# Patient Record
Sex: Male | Born: 1975 | Race: White | Hispanic: No | Marital: Single | Smoking: Never smoker
Health system: Southern US, Community
[De-identification: ages and names within clinical notes are randomized; demographics above are authoritative.]

## PROBLEM LIST (undated history)

## (undated) DIAGNOSIS — A4902 Methicillin resistant Staphylococcus aureus infection, unspecified site: Secondary | ICD-10-CM

## (undated) HISTORY — PX: FRACTURE SURGERY: SHX138

---

## 2018-03-13 ENCOUNTER — Ambulatory Visit (INDEPENDENT_AMBULATORY_CARE_PROVIDER_SITE_OTHER): Payer: 59

## 2018-03-13 ENCOUNTER — Other Ambulatory Visit: Payer: Self-pay

## 2018-03-13 ENCOUNTER — Encounter (HOSPITAL_COMMUNITY): Payer: Self-pay | Admitting: Emergency Medicine

## 2018-03-13 ENCOUNTER — Ambulatory Visit (HOSPITAL_COMMUNITY)
Admission: EM | Admit: 2018-03-13 | Discharge: 2018-03-13 | Disposition: A | Discharge status: Home / Self Care | Payer: 59 | Attending: Internal Medicine | Admitting: Internal Medicine

## 2018-03-13 DIAGNOSIS — S300XXA Contusion of lower back and pelvis, initial encounter: Secondary | ICD-10-CM

## 2018-03-13 MED ORDER — OXYCODONE-ACETAMINOPHEN 5-325 MG PO TABS: 2.0000 | ORAL_TABLET | ORAL | 0 refills | Status: DC | PRN

## 2018-03-13 NOTE — ED Triage Notes (Signed)
Pt was seen 10 days ago in the ED in [place] for this back pain.  He was given muscle relaxers and 600 mg Ibuprofen.  They help some, but he states the muscle relaxer makes him dizzy.

## 2018-03-13 NOTE — ED Provider Notes (Signed)
[facility]    CSN: [id number] Arrival date & time: 03/13/18  1007     History   Chief Complaint Chief Complaint  Patient presents with  . Back Pain    lower    HPI John Lucero is a 42 y.o. male.   42 yo male complains of low back pain x6 days ago. Seen at ED in [place] 5 days ago and dx w/ musculoskeletal pain. No relief from pain meds. Denies loss of bowel or bladder continence. No weakness or numbness in legs. He cannot recall the moment his back began to hurt. Denies trauma to spine or falls.      History reviewed. No pertinent past medical history.  There are no active problems to display for this patient.   Past Surgical History:  Procedure Laterality Date  . FRACTURE SURGERY         Home Medications    Prior to Admission medications   Medication Sig Start Date End Date Taking? Authorizing Provider  cyclobenzaprine (FLEXERIL) 10 MG tablet Take 10 mg by mouth 3 (three) times daily as needed. 03/07/18  Yes [provider]  ibuprofen (ADVIL,MOTRIN) 600 MG tablet Take 600 mg by mouth every 6 (six) hours as needed. 03/07/18 03/17/18 Yes [provider]  oxyCODONE-acetaminophen (PERCOCET/ROXICET) 5-325 MG tablet Take 2 tablets by mouth every 4 (four) hours as needed for severe pain. 03/13/18   Arnaldo Nataliamond, Yudith Norlander S, MD    Family History History reviewed. No pertinent family history.  Social History Social History   Tobacco Use  . Smoking status: Never Smoker  . Smokeless tobacco: Never Used  Substance Use Topics  . Alcohol use: Yes  . Drug use: Never     Allergies   Patient has no known allergies.   Review of Systems Review of Systems  Constitutional: Negative for chills and fever.  HENT: Negative for sore throat and tinnitus.   Eyes: Negative for redness.  Respiratory: Negative for cough and shortness of breath.   Cardiovascular: Negative for chest pain and palpitations.  Gastrointestinal: Negative for abdominal  pain, diarrhea, nausea and vomiting.  Genitourinary: Negative for dysuria, frequency and urgency.  Musculoskeletal: Positive for back pain. Negative for myalgias.  Skin: Negative for rash.       No lesions  Neurological: Negative for weakness.  Hematological: Does not bruise/bleed easily.  Psychiatric/Behavioral: Negative for suicidal ideas.     Physical Exam Triage Vital Signs ED Triage Vitals  Enc Vitals Group     BP 03/13/18 1119 120/76     Pulse Rate 03/13/18 1119 62     Resp --      Temp 03/13/18 1119 98 F (36.7 C)     Temp Source 03/13/18 1119 Oral     SpO2 03/13/18 1119 100 %     Weight --      Height --      Head Circumference --      Peak Flow --      Pain Score 03/13/18 1116 3     Pain Loc --      Pain Edu? --      Excl. in GC? --    No data found.  Updated Vital Signs BP 120/76 (BP Location: Left Arm)   Pulse 62   Temp 98 F (36.7 C) (Oral)   SpO2 100%   Visual Acuity Right Eye Distance:   Left Eye Distance:   Bilateral Distance:    Right Eye Near:   Left Eye  Near:    Bilateral Near:     Physical Exam Constitutional:      General: He is not in acute distress.    Appearance: He is well-developed.  HENT:     Head: Normocephalic and atraumatic.  Eyes:     General: No scleral icterus.    Conjunctiva/sclera: Conjunctivae normal.     Pupils: Pupils are equal, round, and reactive to light.  Neck:     Musculoskeletal: Normal range of motion and neck supple.     Thyroid: No thyromegaly.     Vascular: No JVD.     Trachea: No tracheal deviation.  Cardiovascular:     Rate and Rhythm: Normal rate and regular rhythm.     Heart sounds: Normal heart sounds. No murmur. No friction rub. No gallop.   Pulmonary:     Effort: Pulmonary effort is normal. No respiratory distress.     Breath sounds: Normal breath sounds.  Abdominal:     General: Bowel sounds are normal. There is no distension.     Palpations: Abdomen is soft.     Tenderness: There is no  abdominal tenderness.  Musculoskeletal: Normal range of motion.  Lymphadenopathy:     Cervical: No cervical adenopathy.  Skin:    General: Skin is warm and dry.     Findings: No erythema or rash.  Neurological:     Mental Status: He is alert and oriented to person, place, and time.     Cranial Nerves: No cranial nerve deficit.  Psychiatric:        Behavior: Behavior normal.        Thought Content: Thought content normal.        Judgment: Judgment normal.      UC Treatments / Results  Labs (all labs ordered are listed, but only abnormal results are displayed) Labs Reviewed - No data to display  EKG None  Radiology Dg Sacrum/coccyx  Result Date: 03/13/2018 CLINICAL DATA:  Acute tail bone pain without known injury. EXAM: SACRUM AND COCCYX - 2+ VIEW COMPARISON:  None. FINDINGS: There is no evidence of fracture or other focal bone lesions. IMPRESSION: Negative. Electronically Signed   By: Lupita RaiderJames  Green Jr, M.D.   On: 03/13/2018 11:50    Procedures Procedures (including critical care time)  Medications Ordered in UC Medications - No data to display  Initial Impression / Assessment and Plan / UC Course  I have reviewed the triage vital signs and the nursing notes.  Pertinent labs & imaging results that were available during my care of the patient were reviewed by me and considered in my medical decision making (see chart for details).     Coccygeal injury. Symptomatic care. Patient has a cushion/orthotic to sit on.   Final Clinical Impressions(s) / UC Diagnoses   Final diagnoses:  Contusion of coccyx, initial encounter   Discharge Instructions   None    ED Prescriptions    Medication Sig Dispense Auth. Provider   oxyCODONE-acetaminophen (PERCOCET/ROXICET) 5-325 MG tablet Take 2 tablets by mouth every 4 (four) hours as needed for severe pain. 15 tablet Arnaldo Nataliamond, Barett Whidbee S, MD     Controlled Substance Prescriptions [place] Controlled Substance Registry consulted? No     Arnaldo Nataliamond, Corderro Koloski S, MD 03/13/18 [phone]

## 2019-10-13 ENCOUNTER — Encounter (HOSPITAL_COMMUNITY): Payer: Self-pay | Admitting: Emergency Medicine

## 2019-10-13 ENCOUNTER — Other Ambulatory Visit: Payer: Self-pay

## 2019-10-13 ENCOUNTER — Ambulatory Visit (HOSPITAL_COMMUNITY)
Admission: EM | Admit: 2019-10-13 | Discharge: 2019-10-13 | Disposition: A | Discharge status: Home / Self Care | Payer: 59 | Attending: Family Medicine | Admitting: Family Medicine

## 2019-10-13 DIAGNOSIS — L0291 Cutaneous abscess, unspecified: Secondary | ICD-10-CM | POA: Diagnosis not present

## 2019-10-13 MED ORDER — DOXYCYCLINE HYCLATE 100 MG PO CAPS: 100.0000 mg | ORAL_CAPSULE | Freq: Two times a day (BID) | ORAL | 0 refills | Status: AC

## 2019-10-13 NOTE — ED Provider Notes (Signed)
[facility]    CSN: [id number] Arrival date & time: 10/13/19  0865      History   Chief Complaint Chief Complaint  Patient presents with  . Abscess    HPI Marcos Ruelas is a 44 y.o. male.   Patient is a 44 year old male who presents today with sore to scalp area.  This is been present over the past couple days.  He has similar area adjacent which has healed.  History of MRSA.  Reporting mild tenderness to the area, swelling and expressed some drainage from the area.  Has been doing mupirocin.  No fevers, chills.   ROS per HPI      History reviewed. No pertinent past medical history.  There are no problems to display for this patient.   Past Surgical History:  Procedure Laterality Date  . FRACTURE SURGERY         Home Medications    Prior to Admission medications   Medication Sig Start Date End Date Taking? Authorizing Provider  doxycycline (VIBRAMYCIN) 100 MG capsule Take 1 capsule (100 mg total) by mouth 2 (two) times daily for 7 days. 10/13/19 10/20/19  Janace Aris, NP    Family History History reviewed. No pertinent family history.  Social History Social History   Tobacco Use  . Smoking status: Never Smoker  . Smokeless tobacco: Never Used  Vaping Use  . Vaping Use: Never used  Substance Use Topics  . Alcohol use: Yes  . Drug use: Never     Allergies   Patient has no known allergies.   Review of Systems Review of Systems   Physical Exam Triage Vital Signs ED Triage Vitals  Enc Vitals Group     BP 10/13/19 0949 116/73     Pulse Rate 10/13/19 0949 (!) 55     Resp 10/13/19 0949 16     Temp 10/13/19 0949 98.1 F (36.7 C)     Temp Source 10/13/19 0949 Oral     SpO2 10/13/19 0949 99 %     Weight --      Height --      Head Circumference --      Peak Flow --      Pain Score 10/13/19 0946 4     Pain Loc --      Pain Edu? --      Excl. in GC? --    No data found.  Updated Vital Signs BP 116/73   Pulse (!) 55   Temp  98.1 F (36.7 C) (Oral)   Resp 16   SpO2 99%   Visual Acuity Right Eye Distance:   Left Eye Distance:   Bilateral Distance:    Right Eye Near:   Left Eye Near:    Bilateral Near:     Physical Exam Vitals and nursing note reviewed.  Constitutional:      Appearance: Normal appearance.  HENT:     Head: Normocephalic and atraumatic.     Nose: Nose normal.  Eyes:     Conjunctiva/sclera: Conjunctivae normal.  Pulmonary:     Effort: Pulmonary effort is normal.  Musculoskeletal:        General: Normal range of motion.     Cervical back: Normal range of motion.  Skin:    General: Skin is warm and dry.     Comments: Small 1 cm abscess to the left scalp. Scabbed. Mildly tender to touch.    Neurological:     Mental Status: He is alert.  Psychiatric:        Mood and Affect: Mood normal.      UC Treatments / Results  Labs (all labs ordered are listed, but only abnormal results are displayed) Labs Reviewed - No data to display  EKG   Radiology No results found.  Procedures Procedures (including critical care time)  Medications Ordered in UC Medications - No data to display  Initial Impression / Assessment and Plan / UC Course  I have reviewed the triage vital signs and the nursing notes.  Pertinent labs & imaging results that were available during my care of the patient were reviewed by me and considered in my medical decision making (see chart for details).     Abscess Appears to be healing Keep doing the mupirocin.  Warm compresses and express for more drainage [place] and requesting antibiotic prescription in case problem worsens based on MRSA hx .  I feel this is appropriate.  Will prescribe doxycycline for him to have as needed Follow up as needed for continued or worsening symptoms   Final Clinical Impressions(s) / UC Diagnoses   Final diagnoses:  Abscess     Discharge Instructions     Keep doing the mupirocin Warm  compress or hot shower and express the area  If the problem worsens you can go ahead and start the antibiotics.  Follow up as needed for continued or worsening symptoms     ED Prescriptions    Medication Sig Dispense Auth. Provider   doxycycline (VIBRAMYCIN) 100 MG capsule Take 1 capsule (100 mg total) by mouth 2 (two) times daily for 7 days. 14 capsule Rollie Hynek A, NP     PDMP not reviewed this encounter.   Dahlia Byes A, NP 10/13/19 1030

## 2019-10-13 NOTE — ED Triage Notes (Addendum)
PT reports skin irritation on scalp. He feels as though it is getting infected. History of MRSA infections.

## 2019-10-13 NOTE — Discharge Instructions (Addendum)
Keep doing the mupirocin Warm compress or hot shower and express the area  If the problem worsens you can go ahead and start the antibiotics.  Follow up as needed for continued or worsening symptoms

## 2019-10-14 ENCOUNTER — Ambulatory Visit: Payer: Self-pay | Attending: Internal Medicine

## 2019-10-14 ENCOUNTER — Other Ambulatory Visit: Payer: Self-pay | Admitting: *Deleted

## 2019-10-14 DIAGNOSIS — Z20822 Contact with and (suspected) exposure to covid-19: Secondary | ICD-10-CM

## 2019-10-15 LAB — SARS-COV-2, NAA 2 DAY TAT

## 2019-10-15 LAB — NOVEL CORONAVIRUS, NAA: SARS-CoV-2, NAA: NOT DETECTED

## 2019-10-16 ENCOUNTER — Telehealth: Payer: Self-pay

## 2019-10-16 NOTE — Telephone Encounter (Signed)
Patient called needing a copy of his COVID-19 test result. Result was reviewed Negative was confirmed. [facility] number for test copy was provided to patient.

## 2019-12-31 IMAGING — DX DG SACRUM/COCCYX 2+V
3 series · 3 of 3 positions shown · non-contrast
Comparison: None.

CLINICAL DATA: Acute tail bone pain without known injury.

EXAM:
SACRUM AND COCCYX - 2+ VIEW

[coccyx ap]
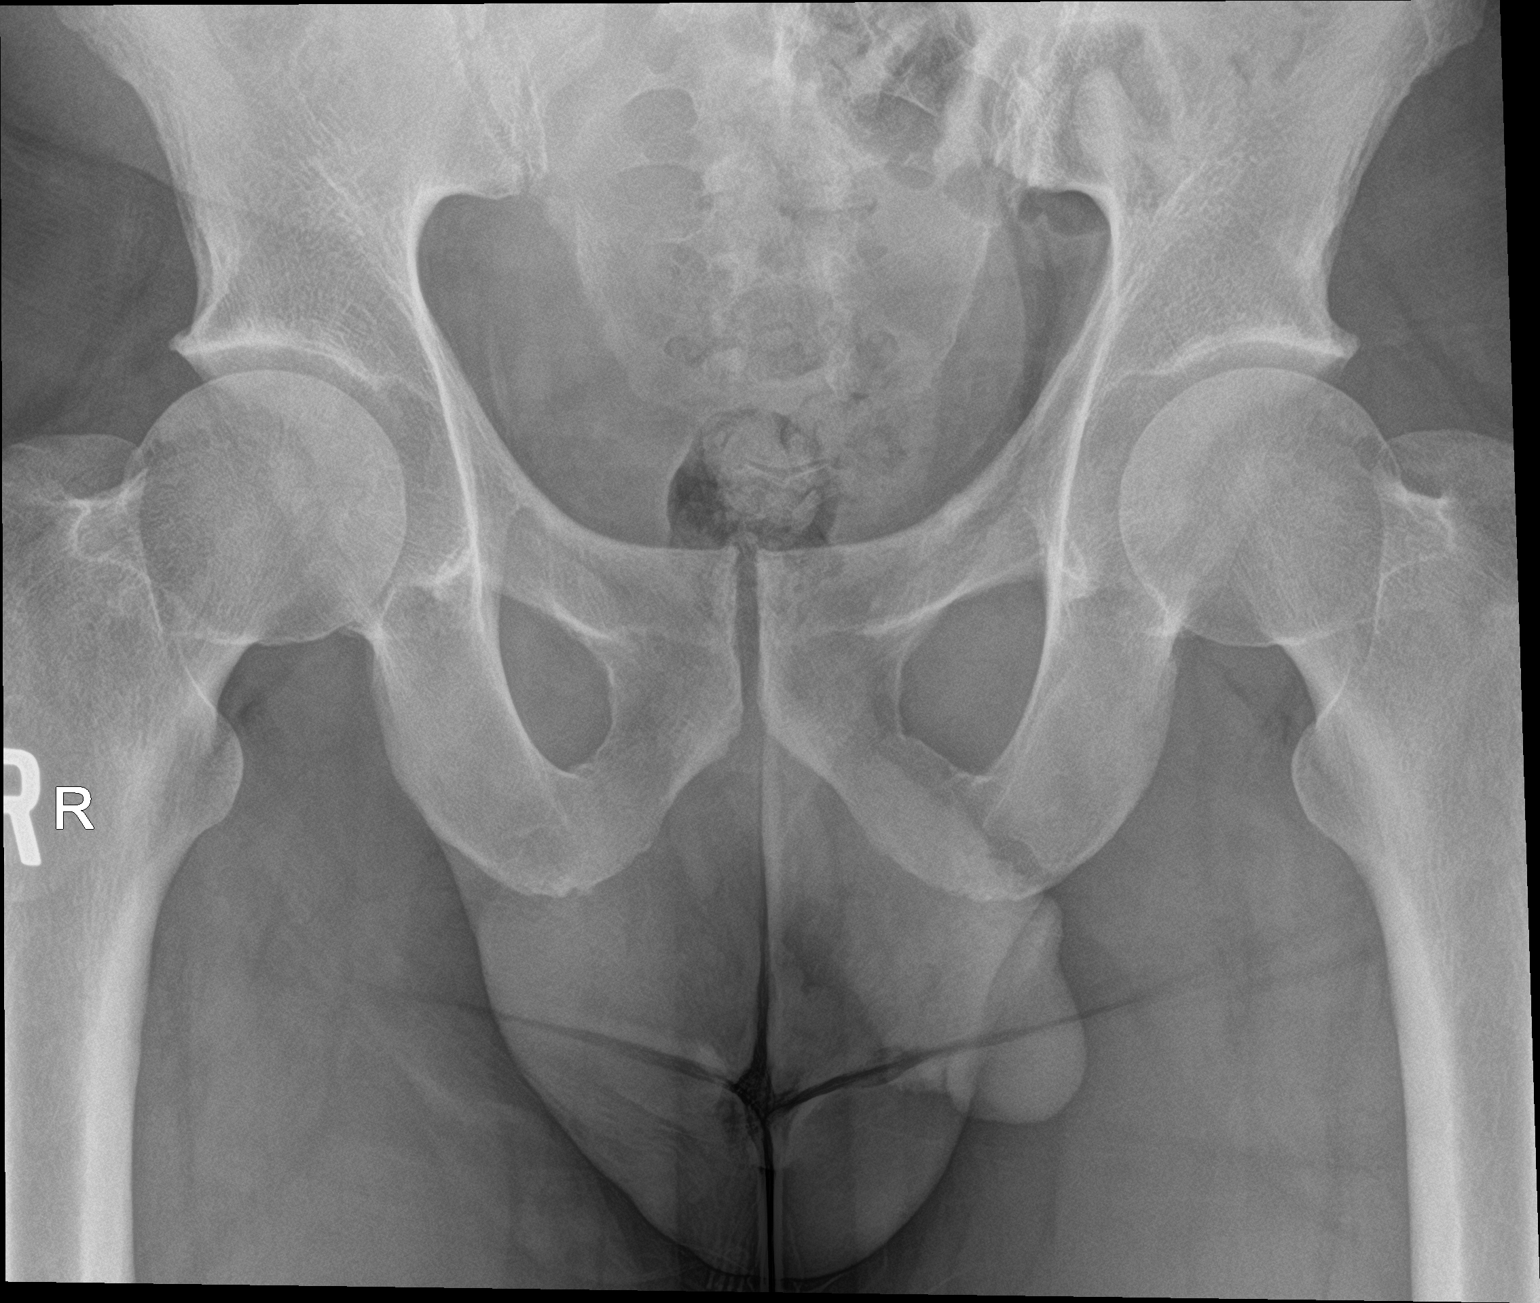

[sacrum ap]
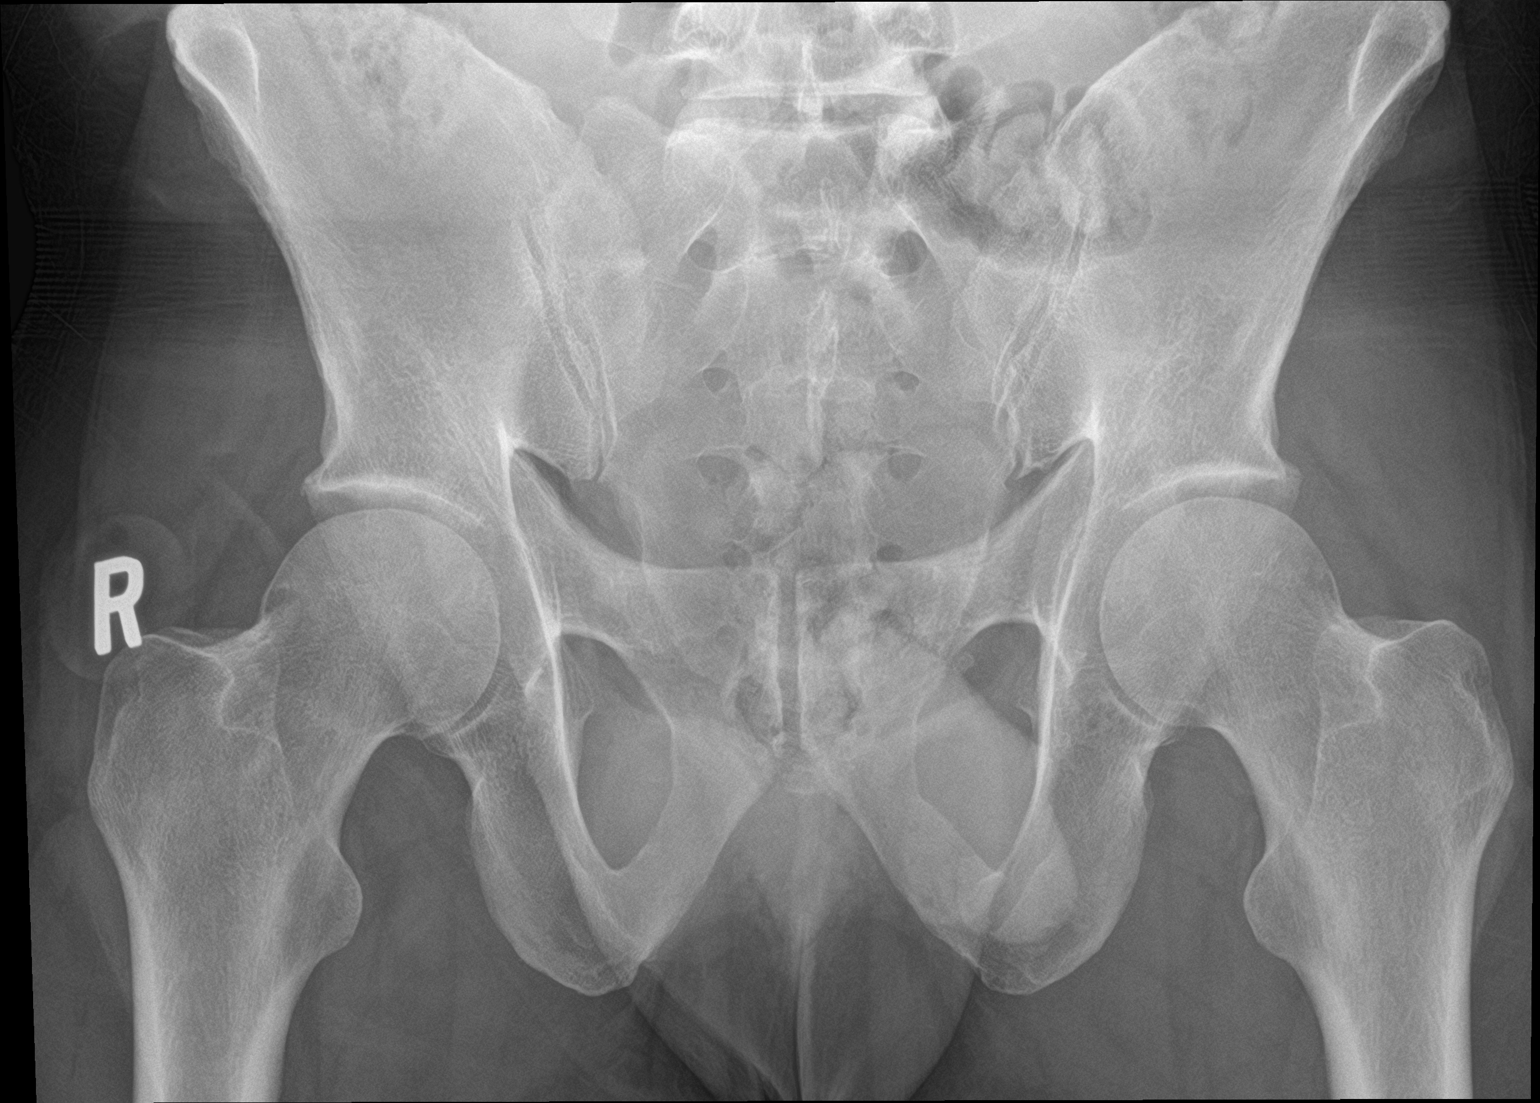

[sacrum lat]
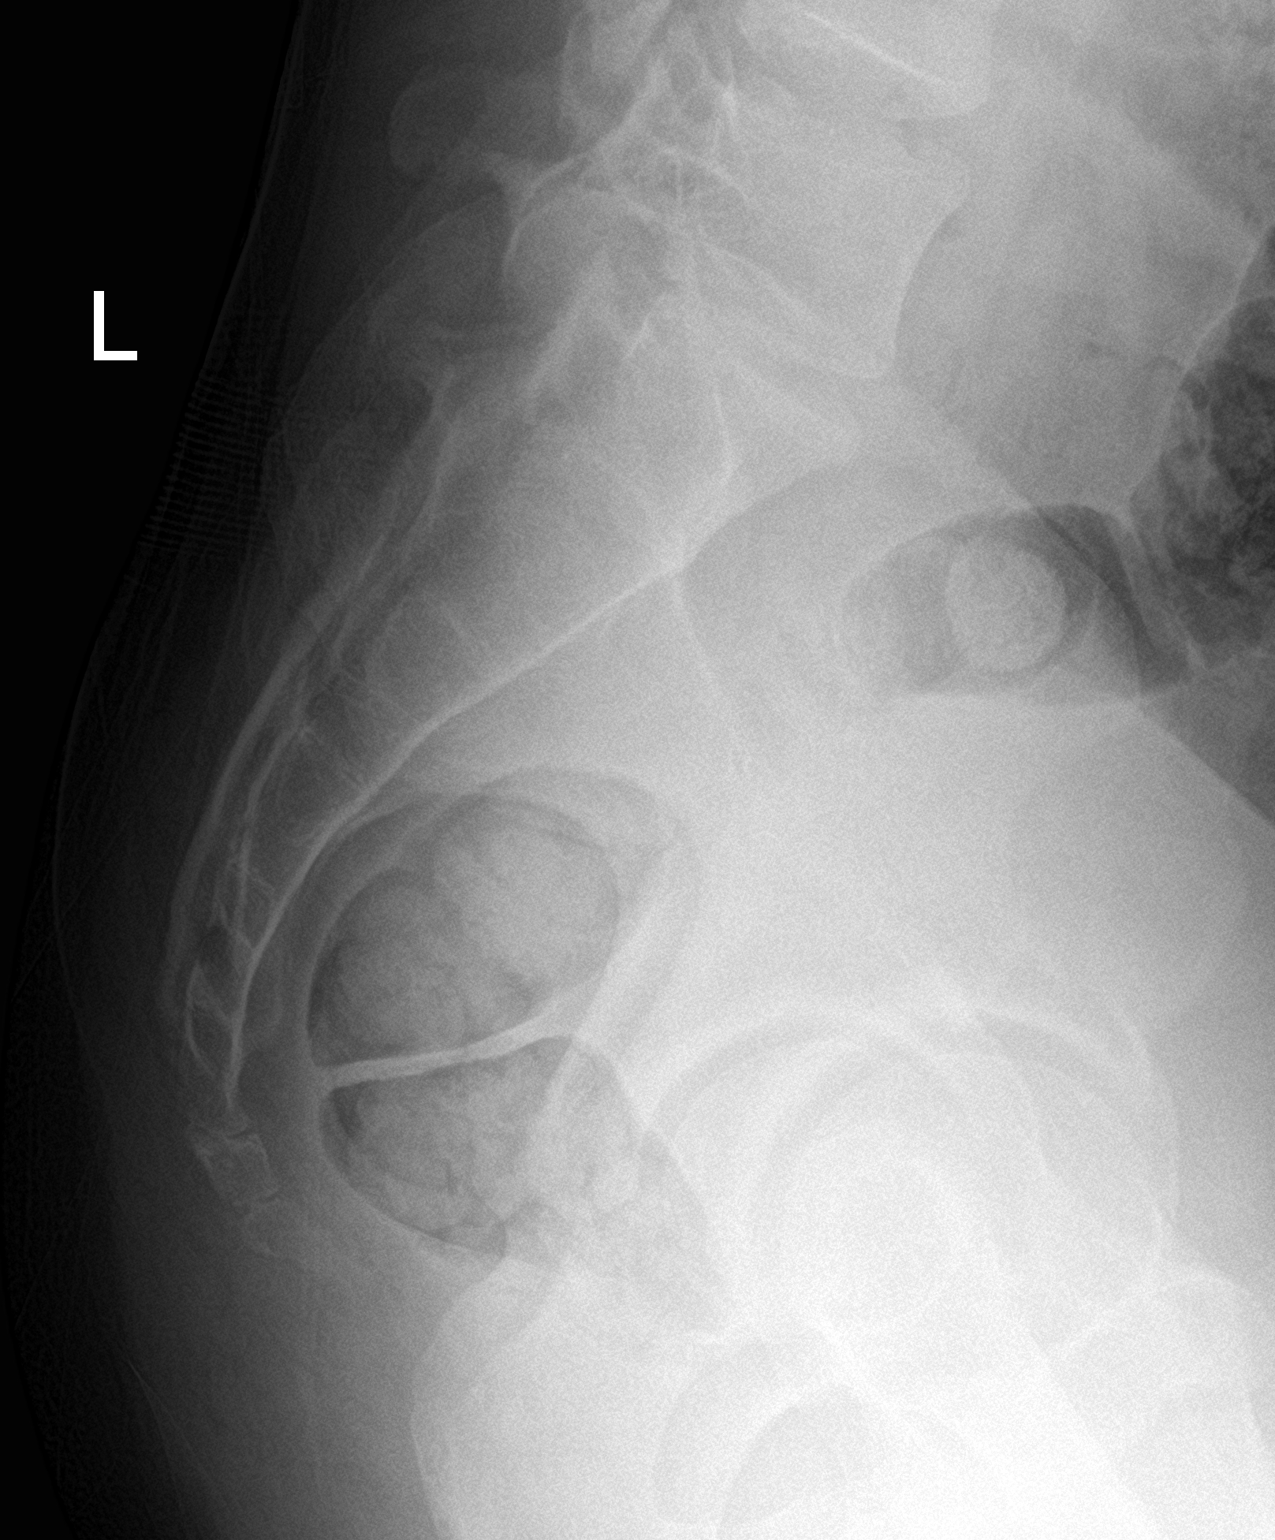

[3 of 3 positions shown; findings below may reference images not displayed]

FINDINGS: There is no evidence of fracture or other focal bone lesions.
IMPRESSION: Negative.

## 2020-03-16 ENCOUNTER — Other Ambulatory Visit: Payer: Self-pay

## 2020-03-16 ENCOUNTER — Encounter (HOSPITAL_COMMUNITY): Payer: Self-pay | Admitting: Emergency Medicine

## 2020-03-16 ENCOUNTER — Ambulatory Visit (HOSPITAL_COMMUNITY)
Admission: EM | Admit: 2020-03-16 | Discharge: 2020-03-16 | Disposition: A | Discharge status: Home / Self Care | Payer: 59 | Attending: Family Medicine | Admitting: Family Medicine

## 2020-03-16 DIAGNOSIS — L089 Local infection of the skin and subcutaneous tissue, unspecified: Secondary | ICD-10-CM

## 2020-03-16 DIAGNOSIS — B9689 Other specified bacterial agents as the cause of diseases classified elsewhere: Secondary | ICD-10-CM

## 2020-03-16 HISTORY — DX: Methicillin resistant Staphylococcus aureus infection, unspecified site: A49.02

## 2020-03-16 MED ORDER — DOXYCYCLINE HYCLATE 100 MG PO CAPS: 100.0000 mg | ORAL_CAPSULE | Freq: Two times a day (BID) | ORAL | 0 refills | Status: AC

## 2020-03-16 NOTE — ED Triage Notes (Signed)
Pt presents with left axilla pain, has history of MRSA yearly.

## 2020-03-17 NOTE — ED Provider Notes (Signed)
  [facility]   [id number] 03/16/20 Arrival Time: 1932  ASSESSMENT & PLAN:  1. Localized bacterial skin infection     After discussion regarding I&D attempt, he elects trial of antibiotic therapy. Will return if worsening.  Begin: Meds ordered this encounter  Medications  . doxycycline (VIBRAMYCIN) 100 MG capsule    Sig: Take 1 capsule (100 mg total) by mouth 2 (two) times daily.    Dispense:  14 capsule    Refill:  0   OTC analgesics if needed.  Reviewed expectations re: course of current medical issues. Questions answered. Outlined signs and symptoms indicating need for more acute intervention. Patient verbalized understanding. After Visit Summary given.   SUBJECTIVE:  John Lucero is a 44 y.o. male who presents with a possible infection of his upper L chest wall. Onset gradual, approximately a few days ago without active drainage and without active bleeding. Symptoms have gradually worsened since beginning. Fever: absent. OTC/home treatment: none reported.   OBJECTIVE:  Vitals:   03/16/20 2010  BP: 118/64  Pulse: 60  Resp: 20  Temp: 97.7 F (36.5 C)  TempSrc: Oral  SpO2: 98%     General appearance: alert; no distress Upper L chest wall: approx 0.5 cm induration/area of skin thickening that is tender to touch; no active drainage or bleeding; no areas of frank fluctuance appreciated Psychological: alert and cooperative; normal mood and affect  No Known Allergies  Past Medical History:  Diagnosis Date  . MRSA (methicillin resistant Staphylococcus aureus)    Social History   Socioeconomic History  . Marital status: Single    Spouse name: Not on file  . Number of children: Not on file  . Years of education: Not on file  . Highest education level: Not on file  Occupational History  . Not on file  Tobacco Use  . Smoking status: Never Smoker  . Smokeless tobacco: Never Used  Vaping Use  . Vaping Use: Never used  Substance and Sexual Activity   . Alcohol use: Yes    Comment: occassional drinker  . Drug use: Never  . Sexual activity: Not on file  Other Topics Concern  . Not on file  Social History Narrative  . Not on file   Social Determinants of Health   Financial Resource Strain: Not on file  Food Insecurity: Not on file  Transportation Needs: Not on file  Physical Activity: Not on file  Stress: Not on file  Social Connections: Not on file   No family history on file. Past Surgical History:  Procedure Laterality Date  . FRACTURE SURGERY             Mardella Layman, MD 03/17/20 [phone]
